# Patient Record
Sex: Female | Born: 2000 | Race: Black or African American | Hispanic: No | Marital: Single | Smoking: Never smoker
Health system: Southern US, Community
[De-identification: ages and names within clinical notes are randomized; demographics above are authoritative.]

## PROBLEM LIST (undated history)

## (undated) DIAGNOSIS — J45909 Unspecified asthma, uncomplicated: Secondary | ICD-10-CM

## (undated) HISTORY — PX: WISDOM TOOTH EXTRACTION: SHX21

---
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
## 2021-09-04 IMAGING — DX DG SCOLIOSIS EVAL COMPLETE SPINE 1V
1 series · 1 of 1 positions shown · non-contrast
Comparison: None.

CLINICAL DATA: Scoliosis

EXAM:
DG SCOLIOSIS EVAL COMPLETE SPINE 1V

[dg scoliosis ap]
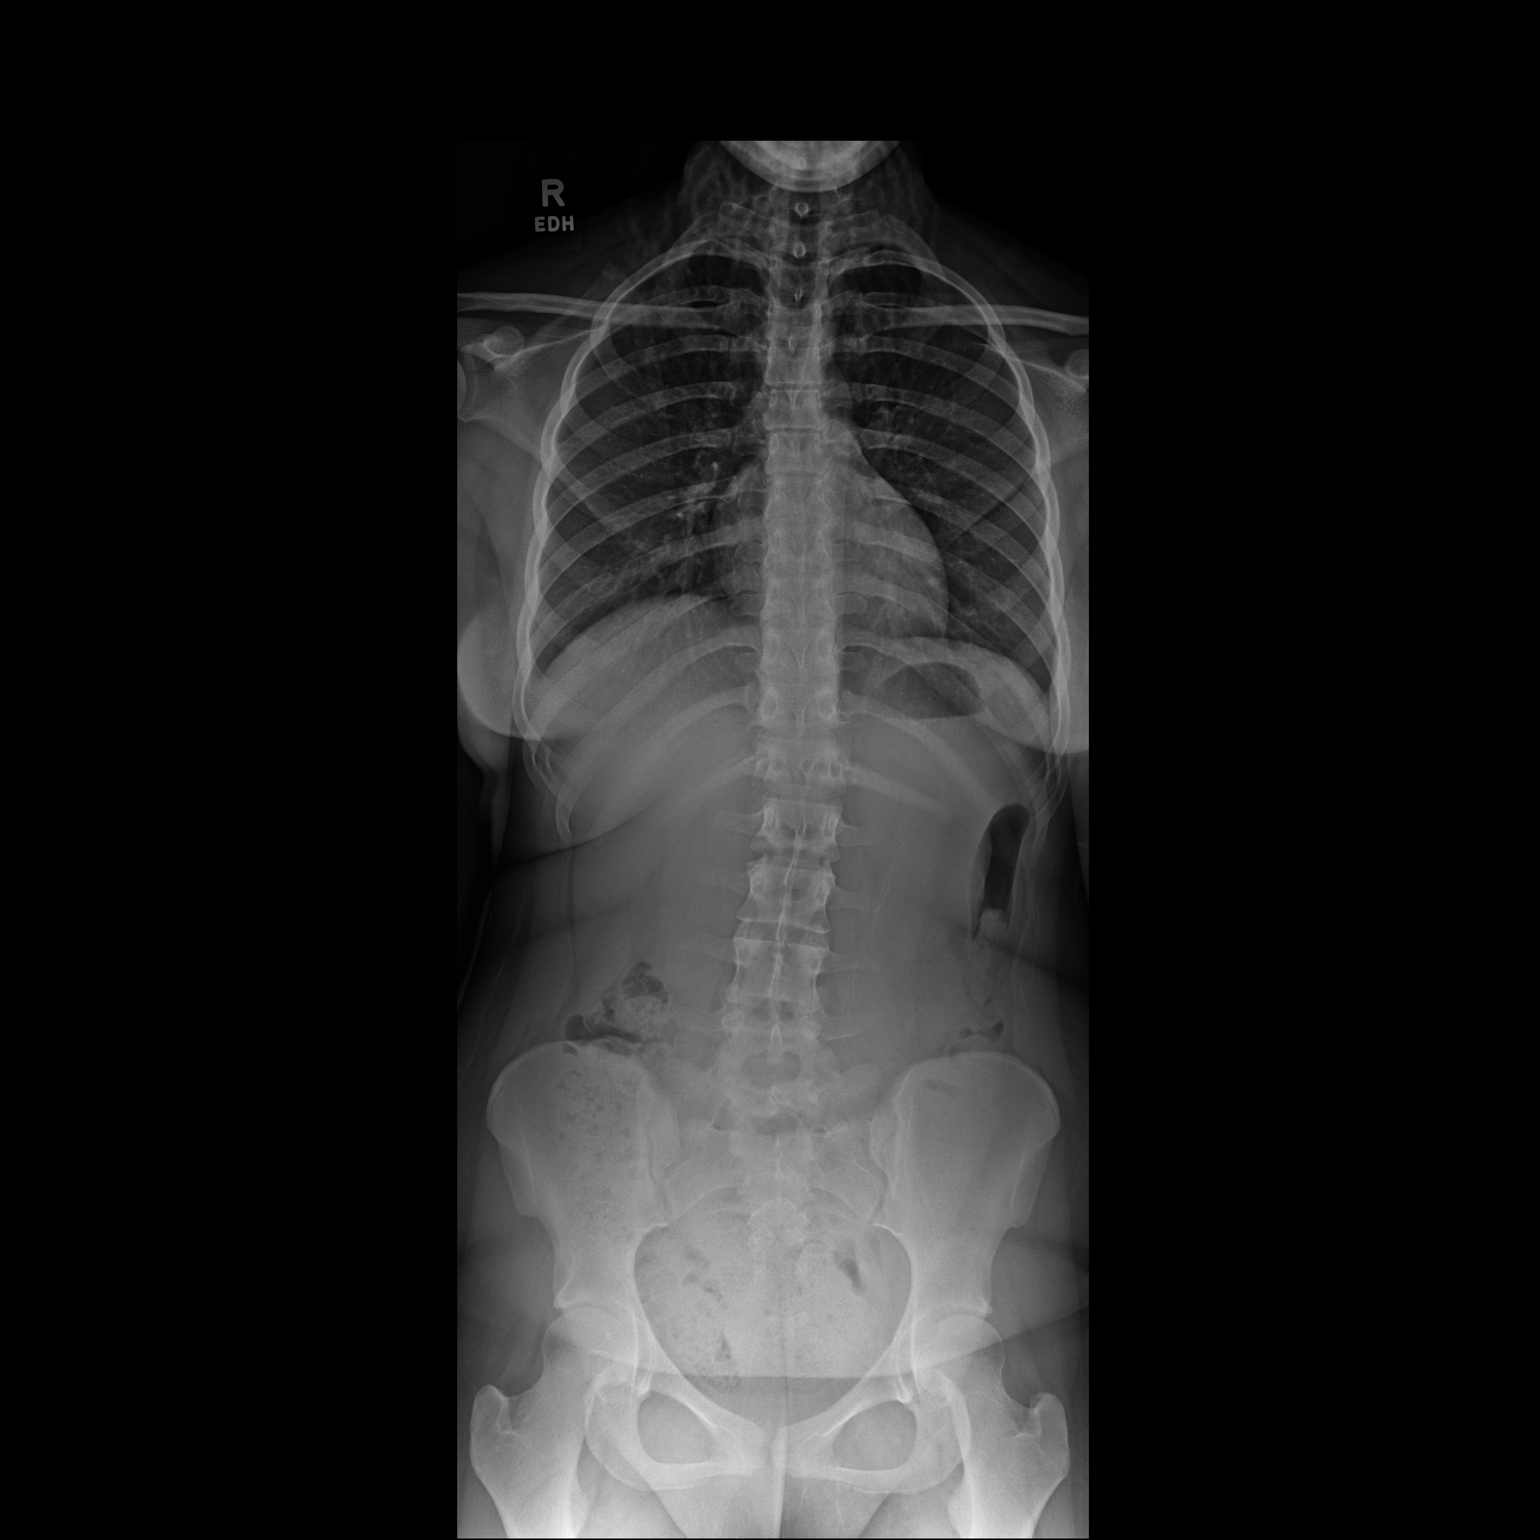

[1 of 1 positions shown; findings below may reference images not displayed]

FINDINGS: There is lumbar dextroscoliosis. The Cobbs angle measurement from
the superior aspect of L1 to the inferior aspect of L4 is 9 degrees.
Note that there are 4 non-rib-bearing lumbar type vertebral bodies.
The vertebral bodies appear normally formed at all levels. Lungs are
clear. Bowel gas pattern is normal. Cardiac silhouette normal.
IMPRESSION: Lumbar dextroscoliosis with Cobbs angle measurement from the
superior aspect of L1 to the inferior aspect of L4 of 9 degrees.
